# Patient Record
Sex: Male | Born: 2000 | Race: Black or African American | Hispanic: No | Marital: Single | Smoking: Never smoker
Health system: Southern US, Community
[De-identification: ages and names within clinical notes are randomized; demographics above are authoritative.]

## PROBLEM LIST (undated history)

## (undated) DIAGNOSIS — J302 Other seasonal allergic rhinitis: Secondary | ICD-10-CM

---
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
## 2020-04-22 ENCOUNTER — Encounter (HOSPITAL_COMMUNITY): Payer: Self-pay

## 2020-04-22 ENCOUNTER — Emergency Department (HOSPITAL_COMMUNITY)
Admission: EM | Admit: 2020-04-22 | Discharge: 2020-04-23 | Disposition: A | Discharge status: Home / Self Care | Payer: Medicaid Other | Attending: Emergency Medicine | Admitting: Emergency Medicine

## 2020-04-22 ENCOUNTER — Other Ambulatory Visit: Payer: Self-pay

## 2020-04-22 ENCOUNTER — Emergency Department (HOSPITAL_COMMUNITY): Payer: Medicaid Other

## 2020-04-22 DIAGNOSIS — R079 Chest pain, unspecified: Secondary | ICD-10-CM | POA: Diagnosis present

## 2020-04-22 DIAGNOSIS — R0602 Shortness of breath: Secondary | ICD-10-CM | POA: Insufficient documentation

## 2020-04-22 DIAGNOSIS — R0981 Nasal congestion: Secondary | ICD-10-CM | POA: Diagnosis not present

## 2020-04-22 DIAGNOSIS — R0789 Other chest pain: Secondary | ICD-10-CM | POA: Insufficient documentation

## 2020-04-22 NOTE — Discharge Instructions (Signed)
Try pepcid or tagamet up to twice a day.  Try to avoid things that may make this worse, most commonly these are spicy foods tomato based products fatty foods chocolate and peppermint.  Alcohol and tobacco can also make this worse.  Return to the emergency department for sudden worsening pain fever or inability to eat or drink.  

## 2020-04-22 NOTE — ED Triage Notes (Signed)
Emergency Medicine Provider Triage Evaluation Note  George Fields , a 20 y.o. male  was evaluated in triage.  Pt complains of chest pain x 3 days ago. No fever. No CAD, or family hx of cardiac disease.   Review of Systems  Positive: Chest pain, sob, nasal congestion.  Negative: fever  Physical Exam  BP 133/82 (BP Location: Left Arm)   Pulse 75   Temp 98.4 F (36.9 C) (Oral)   Resp 14   Ht 5\' 9"  (1.753 m)   Wt 78 kg   SpO2 100%   BMI 25.40 kg/m  Gen:   Awake, no distress   HEENT:  Atraumatic Resp:  Normal effort  Cardiac:  Normal rate  Abd:   Nondistended, nontender  MSK:   Moves extremities without difficulty  Neuro:  Speech clear   Medical Decision Making  Medically screening exam initiated at 8:24 PM.  Appropriate orders placed.  Jacolby Risby was informed that the remainder of the evaluation will be completed by another provider, this initial triage assessment does not replace that evaluation, and the importance of remaining in the ED until their evaluation is complete.  Clinical Impression  Patient here with chest pain and shortness of breath, pain feels worse with exhaling. Non smoker.    Helene Shoe, PA-C 04/22/20 2026

## 2020-04-22 NOTE — ED Provider Notes (Signed)
[facility]-EMERGENCY DEPT Provider Note   CSN: [id number] Arrival date & time: 04/22/20  1947     History Chief Complaint  Patient presents with  . Nasal Congestion    George Fields is a 20 y.o. male.  21 yo M with a chief complaints of chest pain.  This is been off and on for the past few days.  Lasts for seconds at a time seems to come and go at random.  Not exertional.  Has some shortness of breath sometimes with it.  Denies nausea or vomiting.  Denies cough or fever.  Has had some nasal congestion going on for a few weeks now consistent with his prior history of allergies.  Patient denies history of MI, denies hypertension hyperlipidemia diabetes or smoking.  Denies family history of MI.  Patient denies history of PE or DVT denies hemoptysis denies unilateral lower extremity edema denies recent surgery immobilization hospitalization estrogen use or history of cancer.   The history is provided by the patient.  Illness Severity:  Moderate Onset quality:  Gradual Duration:  2 days Timing:  Intermittent Progression:  Waxing and waning Chronicity:  New Associated symptoms: chest pain, congestion and shortness of breath   Associated symptoms: no abdominal pain, no diarrhea, no fever, no headaches, no myalgias, no rash and no vomiting        Past Medical History:  Diagnosis Date  . Seasonal allergies     There are no problems to display for this patient.   History reviewed. No pertinent surgical history.     History reviewed. No pertinent family history.  Social History   Tobacco Use  . Smoking status: Never Smoker  . Smokeless tobacco: Never Used  Substance Use Topics  . Alcohol use: Yes  . Drug use: Yes    Types: Marijuana    Home Medications Prior to Admission medications   Medication Sig Start Date End Date Taking? Authorizing Provider  cetirizine (ZYRTEC ALLERGY) 10 MG tablet Take 1 tablet (10 mg total) by mouth at  bedtime. Patient not taking: Reported on 04/25/2019 12/24/13 04/25/19  Lowanda Foster, NP  loratadine (CLARITIN) 10 MG tablet Take 1 tablet (10 mg total) by mouth daily. Patient not taking: Reported on 04/25/2019 04/16/16 04/25/19  Audry Pili, PA-C  potassium chloride SA (KLOR-CON) 20 MEQ tablet Take 1 tablet (20 mEq total) by mouth 2 (two) times daily. Patient not taking: Reported on 04/25/2019 04/02/19 04/25/19  Devoria Albe, MD    Allergies    Patient has no known allergies.  Review of Systems   Review of Systems  Constitutional: Negative for chills and fever.  HENT: Positive for congestion. Negative for facial swelling.   Eyes: Negative for discharge and visual disturbance.  Respiratory: Positive for shortness of breath.   Cardiovascular: Positive for chest pain. Negative for palpitations.  Gastrointestinal: Negative for abdominal pain, diarrhea and vomiting.  Musculoskeletal: Negative for arthralgias and myalgias.  Skin: Negative for color change and rash.  Neurological: Negative for tremors, syncope and headaches.  Psychiatric/Behavioral: Negative for confusion and dysphoric mood.    Physical Exam Updated Vital Signs BP 133/82 (BP Location: Left Arm)   Pulse 75   Temp 98.4 F (36.9 C) (Oral)   Resp 14   Ht 5\' 9"  (1.753 m)   Wt 78 kg   SpO2 100%   BMI 25.40 kg/m   Physical Exam Vitals and nursing note reviewed.  Constitutional:      Appearance: He is well-developed.  HENT:  Head: Normocephalic and atraumatic.  Eyes:     Pupils: Pupils are equal, round, and reactive to light.  Neck:     Vascular: No JVD.  Cardiovascular:     Rate and Rhythm: Normal rate and regular rhythm.     Heart sounds: No murmur heard. No friction rub. No gallop.   Pulmonary:     Effort: No respiratory distress.     Breath sounds: No wheezing.  Abdominal:     General: There is no distension.     Tenderness: There is no abdominal tenderness. There is no guarding or rebound.  Musculoskeletal:         General: Normal range of motion.     Cervical back: Normal range of motion and neck supple.  Skin:    Coloration: Skin is not pale.     Findings: No rash.  Neurological:     Mental Status: He is alert and oriented to person, place, and time.  Psychiatric:        Behavior: Behavior normal.     ED Results / Procedures / Treatments   Labs (all labs ordered are listed, but only abnormal results are displayed) Labs Reviewed - No data to display  EKG EKG Interpretation  Date/Time:  Tuesday April 22 2020 23:29:34 EDT Ventricular Rate:  63 PR Interval:  147 QRS Duration: 104 QT Interval:  382 QTC Calculation: 391 R Axis:   82 Text Interpretation: Sinus rhythm ST elev, probable normal early repol pattern flipped t waves in inferior leads seen on prior Otherwise no significant change Confirmed by Melene Plan [phone]) on 04/22/2020 11:55:36 PM   Radiology DG Chest 2 View  Result Date: 04/22/2020 CLINICAL DATA:  Mid chest pain and shortness of breath for 1 week. EXAM: CHEST - 2 VIEW COMPARISON:  Chest x-ray 04/02/2019 FINDINGS: The cardiac silhouette, mediastinal and hilar contours are normal. The lungs are clear. The bony thorax is intact. IMPRESSION: Normal chest x-ray. Electronically Signed   By: Rudie Meyer M.D.   On: 04/22/2020 20:41    Procedures Procedures   Medications Ordered in ED Medications - No data to display  ED Course  I have reviewed the triage vital signs and the nursing notes.  Pertinent labs & imaging results that were available during my care of the patient were reviewed by me and considered in my medical decision making (see chart for details).    MDM Rules/Calculators/A&P                          20 yo M with a chief complaint of chest discomfort.  Atypical in nature going on for a few days now.  Patient is extremely low risk for an MI.  Chest x-ray viewed by me without focal infiltrate or pneumothorax.  PERC negative.  11:57 PM:  I have discussed  the diagnosis/risks/treatment options with the patient and believe the pt to be eligible for discharge home to follow-up with PCP. We also discussed returning to the ED immediately if new or worsening sx occur. We discussed the sx which are most concerning (e.g., sudden worsening pain, fever, inability to tolerate by mouth) that necessitate immediate return. Medications administered to the patient during their visit and any new prescriptions provided to the patient are listed below.  Medications given during this visit Medications - No data to display   The patient appears reasonably screen and/or stabilized for discharge and I doubt any other medical condition or other [facility]  requiring further screening, evaluation, or treatment in the ED at this time prior to discharge.   Final Clinical Impression(s) / ED Diagnoses Final diagnoses:  Nonspecific chest pain    Rx / DC Orders ED Discharge Orders    None       Melene Plan, DO 04/22/20 2357

## 2020-04-22 NOTE — ED Triage Notes (Signed)
Pt c/o intermittent SOB and congestion x 3 days, denies fevers/cough. States he has felt fine all day today and had improvement with taking a benadryl yesterday

## 2020-04-22 NOTE — ED Notes (Signed)
Patient was called to take back to a room but no response. x1 

## 2021-01-11 ENCOUNTER — Encounter (HOSPITAL_COMMUNITY): Payer: Self-pay

## 2021-01-11 ENCOUNTER — Other Ambulatory Visit: Payer: Self-pay

## 2021-01-11 ENCOUNTER — Emergency Department (HOSPITAL_COMMUNITY)
Admission: EM | Admit: 2021-01-11 | Discharge: 2021-01-12 | Disposition: A | Discharge status: Home / Self Care | Payer: Medicaid Other | Attending: Emergency Medicine | Admitting: Emergency Medicine

## 2021-01-11 DIAGNOSIS — R070 Pain in throat: Secondary | ICD-10-CM | POA: Diagnosis present

## 2021-01-11 DIAGNOSIS — R0989 Other specified symptoms and signs involving the circulatory and respiratory systems: Secondary | ICD-10-CM | POA: Insufficient documentation

## 2021-01-11 DIAGNOSIS — Z5321 Procedure and treatment not carried out due to patient leaving prior to being seen by health care provider: Secondary | ICD-10-CM | POA: Diagnosis not present

## 2021-01-11 NOTE — ED Triage Notes (Signed)
Pt states he choked on a piece of seafood earlier at dinner, pt states he made himself cough and believes he coughed it out. Pt states his throat "feels funny". Pt denies SOB. Pt able to speak with no difficulty. Pt does not have a raspy or hoarse voice, no bleeding.

## 2021-01-11 NOTE — ED Notes (Signed)
Cortni Coutre, PA bedside.

## 2021-01-12 NOTE — ED Provider Notes (Signed)
Emergency Medicine Provider Triage Evaluation Note  George Fields , a 21 y.o. male  was evaluated in triage.  Pt complains of throat discomfort. Ate shrimp pta and thought he ate a shell. Started coughing and now feels abnormal sensation in throat.  Review of Systems  Positive: Abnormal sensation in throat after choking Negative: Rash, wheezing, sob, angioedema  Physical Exam  BP 122/79    Pulse 77    Temp 98 F (36.7 C) (Oral)    Resp 18    SpO2 99%  Gen:   Awake, no distress   Resp:  Normal effort  MSK:   Moves extremities without difficulty  Other:  Clear speech, no distress, no rashes, no wheezing  Medical Decision Making  Medically screening exam initiated at 12:42 AM.  Appropriate orders placed.  George Fields was informed that the remainder of the evaluation will be completed by another provider, this initial triage assessment does not replace that evaluation, and the importance of remaining in the ED until their evaluation is complete.  No evidence of anaphylaxis. Airway patent.    George Fields 01/12/21 0043    Paula Libra, MD 01/12/21 [phone]

## 2021-05-19 ENCOUNTER — Ambulatory Visit (HOSPITAL_COMMUNITY)
Admission: EM | Admit: 2021-05-19 | Discharge: 2021-05-19 | Disposition: A | Discharge status: Home / Self Care | Payer: Medicaid Other | Attending: Emergency Medicine | Admitting: Emergency Medicine

## 2021-05-19 ENCOUNTER — Ambulatory Visit (INDEPENDENT_AMBULATORY_CARE_PROVIDER_SITE_OTHER): Payer: Medicaid Other

## 2021-05-19 ENCOUNTER — Encounter (HOSPITAL_COMMUNITY): Payer: Self-pay | Admitting: Emergency Medicine

## 2021-05-19 DIAGNOSIS — S62306A Unspecified fracture of fifth metacarpal bone, right hand, initial encounter for closed fracture: Secondary | ICD-10-CM | POA: Diagnosis not present

## 2021-05-19 NOTE — ED Provider Notes (Signed)
?[facility] ? ? ? ?CSN: [id number] ?Arrival date & time: 05/19/21  1246 ? ? ?  ? ?History   ?Chief Complaint ?Chief Complaint  ?Patient presents with  ? Hand Injury  ? ? ?HPI ?George Fields is a 21 y.o. male.  ? ?Patient presents with right hand pain and swelling for 2 days after possible injury.  Endorses that he hit his hand against something during a lot of chaos 2 days ago.  Denies punching object or person.range of motion intact but elicits pain.  Endorses numbness to the fifth right finger.  Has not attempted treatment of symptoms. ? ? ? ? ?Past Medical History:  ?Diagnosis Date  ? Seasonal allergies   ? ? ?There are no problems to display for this patient. ? ? ?History reviewed. No pertinent surgical history. ? ? ? ? ?Home Medications   ? ?Prior to Admission medications   ?Medication Sig Start Date End Date Taking? Authorizing Provider  ?cetirizine (ZYRTEC ALLERGY) 10 MG tablet Take 1 tablet (10 mg total) by mouth at bedtime. ?Patient not taking: Reported on 04/25/2019 12/24/13 04/25/19  Lowanda Foster, NP  ?loratadine (CLARITIN) 10 MG tablet Take 1 tablet (10 mg total) by mouth daily. ?Patient not taking: Reported on 04/25/2019 04/16/16 04/25/19  Audry Pili, PA-C  ?potassium chloride SA (KLOR-CON) 20 MEQ tablet Take 1 tablet (20 mEq total) by mouth 2 (two) times daily. ?Patient not taking: Reported on 04/25/2019 04/02/19 04/25/19  Devoria Albe, MD  ? ? ?Family History ?No family history on file. ? ?Social History ?Social History  ? ?Tobacco Use  ? Smoking status: Never  ? Smokeless tobacco: Never  ?Substance Use Topics  ? Alcohol use: Yes  ? Drug use: Yes  ?  Types: Marijuana  ? ? ? ?Allergies   ?Patient has no known allergies. ? ? ?Review of Systems ?Review of Systems  ?Constitutional: Negative.   ?Respiratory: Negative.    ?Cardiovascular: Negative.   ?Skin: Negative.   ? ? ?Physical Exam ?Triage Vital Signs ?ED Triage Vitals  ?Enc Vitals Group  ?   BP 05/19/21 1345 113/77  ?   Pulse Rate 05/19/21 1345  62  ?   Resp 05/19/21 1345 14  ?   Temp 05/19/21 1345 98.7 ?F (37.1 ?C)  ?   Temp Source 05/19/21 1345 Oral  ?   SpO2 05/19/21 1345 98 %  ?   Weight --   ?   Height --   ?   Head Circumference --   ?   Peak Flow --   ?   Pain Score 05/19/21 1344 10  ?   Pain Loc --   ?   Pain Edu? --   ?   Excl. in GC? --   ? ?No data found. ? ?Updated Vital Signs ?BP 113/77 (BP Location: Left Arm)   Pulse 62   Temp 98.7 ?F (37.1 ?C) (Oral)   Resp 14   SpO2 98%  ? ?Visual Acuity ?Right Eye Distance:   ?Left Eye Distance:   ?Bilateral Distance:   ? ?Right Eye Near:   ?Left Eye Near:    ?Bilateral Near:    ? ?Physical Exam ?Constitutional:   ?   Appearance: Normal appearance.  ?HENT:  ?   Head: Normocephalic.  ?Eyes:  ?   Extraocular Movements: Extraocular movements intact.  ?Pulmonary:  ?   Effort: Pulmonary effort is normal.  ?Musculoskeletal:  ?   Comments: Unable to reproduce tenderness however pain is noted to  be at the base of the fourth and fifth metacarpal, moderate swelling over the dorsum aspect of the metacarpals, no ecchymosis, range of motion intact, decreased sensation at the fifth finger, capillary refill less than 3, 2+ radial pulse  ?Skin: ?   General: Skin is warm and dry.  ?Neurological:  ?   Mental Status: He is alert and oriented to person, place, and time. Mental status is at baseline.  ?Psychiatric:     ?   Mood and Affect: Mood normal.     ?   Behavior: Behavior normal.  ? ? ? ?UC Treatments / Results  ?Labs ?(all labs ordered are listed, but only abnormal results are displayed) ?Labs Reviewed - No data to display ? ?EKG ? ? ?Radiology ?No results found. ? ?Procedures ?Procedures (including critical care time) ? ?Medications Ordered in UC ?Medications - No data to display ? ?Initial Impression / Assessment and Plan / UC Course  ?I have reviewed the triage vital signs and the nursing notes. ? ?Pertinent labs & imaging results that were available during my care of the patient were reviewed by me and  considered in my medical decision making (see chart for details). ? ?Closed nondisplaced fracture of the fifth metacarpal bone of right hand, initial encounter ? ?Confirmed via x-ray, discussed findings with patient, ulnar gutter splint placed by Ortho tech, neurovascularly intact prior to and after placement, non weight bearing precautions given, may use over-the-counter Tylenol or ibuprofen for management of pain, recommended Ortho follow-up for further evaluation and management, work note given  ?Final Clinical Impressions(s) / UC Diagnoses  ? ?Final diagnoses:  ?None  ? ?Discharge Instructions   ?None ?  ? ?ED Prescriptions   ?None ?  ? ?PDMP not reviewed this encounter. ?  ?Valinda Hoar, NP ?05/19/21 1454 ? ?

## 2021-05-19 NOTE — ED Notes (Signed)
Ortho made aware of splint ordered.  

## 2021-05-19 NOTE — ED Triage Notes (Signed)
Pt thinks he hit something Saturday causing pain and swelling to right hand.  ?

## 2021-05-19 NOTE — Discharge Instructions (Addendum)
Your x-ray today showed a fracture ( break in bone) of base of your 5 th finger ? ?A splint has been applied to your hand/arm. This is used to protect your injury and prevent further damage. Leave in place until seen by orthopedic specialist. Do not put objects into splint to scratch skin. If numbness or tingling occurs after placement please return to Urgent Care for evaluation.  ? ?Follow up with orthopedic specialist in 1-2 weeks. Call practice to make appointment. Information listed below. You may go to any orthopedic provider you deem fit. ? ?Please do not put weight on fracture.  ? ? ?

## 2022-08-09 ENCOUNTER — Other Ambulatory Visit: Payer: Self-pay

## 2022-08-09 ENCOUNTER — Encounter (HOSPITAL_BASED_OUTPATIENT_CLINIC_OR_DEPARTMENT_OTHER): Payer: Self-pay | Admitting: Emergency Medicine

## 2022-08-09 ENCOUNTER — Emergency Department (HOSPITAL_BASED_OUTPATIENT_CLINIC_OR_DEPARTMENT_OTHER)
Admission: EM | Admit: 2022-08-09 | Discharge: 2022-08-09 | Disposition: A | Discharge status: Home / Self Care | Payer: Medicaid Other | Attending: Emergency Medicine | Admitting: Emergency Medicine

## 2022-08-09 ENCOUNTER — Emergency Department (HOSPITAL_BASED_OUTPATIENT_CLINIC_OR_DEPARTMENT_OTHER): Payer: Medicaid Other

## 2022-08-09 DIAGNOSIS — R6884 Jaw pain: Secondary | ICD-10-CM | POA: Diagnosis not present

## 2022-08-09 DIAGNOSIS — R519 Headache, unspecified: Secondary | ICD-10-CM | POA: Insufficient documentation

## 2022-08-09 MED ORDER — AMOXICILLIN 500 MG PO CAPS
500.0000 mg | ORAL_CAPSULE | Freq: Once | ORAL | Status: AC
  Administered 2022-08-09: 500 mg via ORAL
  Filled 2022-08-09: qty 1

## 2022-08-09 MED ORDER — ACETAMINOPHEN 325 MG PO TABS
650.0000 mg | ORAL_TABLET | Freq: Once | ORAL | Status: AC
  Administered 2022-08-09: 650 mg via ORAL
  Filled 2022-08-09: qty 2

## 2022-08-09 MED ORDER — AMOXICILLIN 500 MG PO CAPS: 500.0000 mg | ORAL_CAPSULE | Freq: Two times a day (BID) | ORAL | 0 refills | Status: AC

## 2022-08-09 NOTE — ED Triage Notes (Signed)
Pt arrives to ED with c/o left sided jaw pain x3 days.

## 2022-08-09 NOTE — ED Notes (Signed)
Patient verbalizes understanding of discharge instructions. Opportunity for questioning and answers were provided. Armband removed by staff, pt discharged from ED. Ambulated out to lobby  

## 2022-08-09 NOTE — Discharge Instructions (Signed)
Follow-up with dentist, primary care, neurology.  Return if symptoms worsen.  Take antibiotic as prescribed.

## 2022-08-09 NOTE — ED Provider Notes (Signed)
[name] Provider Note   CSN: [id number] Arrival date & time: 08/09/22  1308     History  Chief Complaint  Patient presents with   Jaw Pain    George Fields is a 22 y.o. male.  Patient here with left-sided headache, left jaw pain.  Denies any fever or chills.  No neck pain.  No obvious dental pain.  Nothing makes it worse or better.  Denies any trauma or hearing loss.  Denies any significant medical history otherwise.  The history is provided by the patient.       Home Medications Prior to Admission medications   Medication Sig Start Date End Date Taking? Authorizing Provider  amoxicillin (AMOXIL) 500 MG capsule Take 1 capsule (500 mg total) by mouth 2 (two) times daily for 7 days. 08/09/22 08/16/22 Yes Shemeika Starzyk, DO  cetirizine (ZYRTEC ALLERGY) 10 MG tablet Take 1 tablet (10 mg total) by mouth at bedtime. Patient not taking: Reported on 04/25/2019 12/24/13 04/25/19  Lowanda Foster, NP  loratadine (CLARITIN) 10 MG tablet Take 1 tablet (10 mg total) by mouth daily. Patient not taking: Reported on 04/25/2019 04/16/16 04/25/19  Audry Pili, PA-C  potassium chloride SA (KLOR-CON) 20 MEQ tablet Take 1 tablet (20 mEq total) by mouth 2 (two) times daily. Patient not taking: Reported on 04/25/2019 04/02/19 04/25/19  Devoria Albe, MD      Allergies    Patient has no known allergies.    Review of Systems   Review of Systems  Physical Exam Updated Vital Signs BP 126/80 (BP Location: Right Arm)   Pulse 61   Temp 98.6 F (37 C) (Oral)   Resp 14   Ht 5\' 9"  (1.753 m)   Wt 77.1 kg   SpO2 99%   BMI 25.10 kg/m  Physical Exam Vitals and nursing note reviewed.  Constitutional:      General: He is not in acute distress.    Appearance: He is well-developed. He is not ill-appearing.  HENT:     Head: Normocephalic and atraumatic.     Comments: Tenderness over the left angle of the mandible    Nose: Nose normal.     Mouth/Throat:      Mouth: Mucous membranes are moist.  Eyes:     Extraocular Movements: Extraocular movements intact.     Conjunctiva/sclera: Conjunctivae normal.     Pupils: Pupils are equal, round, and reactive to light.  Cardiovascular:     Rate and Rhythm: Normal rate and regular rhythm.     Heart sounds: No murmur heard. Pulmonary:     Effort: Pulmonary effort is normal. No respiratory distress.     Breath sounds: Normal breath sounds.  Abdominal:     Palpations: Abdomen is soft.     Tenderness: There is no abdominal tenderness.  Musculoskeletal:        General: No swelling.     Cervical back: Normal range of motion and neck supple.  Skin:    General: Skin is warm and dry.     Capillary Refill: Capillary refill takes less than 2 seconds.  Neurological:     General: No focal deficit present.     Mental Status: He is alert and oriented to person, place, and time.     Cranial Nerves: No cranial nerve deficit.     Sensory: No sensory deficit.     Motor: No weakness.     Coordination: Coordination normal.     Gait: Gait normal.  Comments: 5+ out of 5 strength throughout, normal sensation, no drift, normal finger-nose-finger, normal speech  Psychiatric:        Mood and Affect: Mood normal.     ED Results / Procedures / Treatments   Labs (all labs ordered are listed, but only abnormal results are displayed) Labs Reviewed - No data to display  EKG None  Radiology CT Head Wo Contrast  Result Date: 08/09/2022 CLINICAL DATA:  Headache EXAM: CT HEAD WITHOUT CONTRAST TECHNIQUE: Contiguous axial images were obtained from the base of the skull through the vertex without intravenous contrast. RADIATION DOSE REDUCTION: This exam was performed according to the departmental dose-optimization program which includes automated exposure control, adjustment of the mA and/or kV according to patient size and/or use of iterative reconstruction technique. COMPARISON:  None Available. FINDINGS: Brain: There is  no mass, hemorrhage or extra-axial collection. The size and configuration of the ventricles and extra-axial CSF spaces are normal. The brain parenchyma is normal, without acute or chronic infarction. Vascular: No abnormal hyperdensity of the major intracranial arteries or dural venous sinuses. No intracranial atherosclerosis. Skull: The visualized skull base, calvarium and extracranial soft tissues are normal. Sinuses/Orbits: No fluid levels or advanced mucosal thickening of the visualized paranasal sinuses. No mastoid or middle ear effusion. The orbits are normal. IMPRESSION: Normal head CT. Electronically Signed   By: Deatra Robinson M.D.   On: 08/09/2022 19:26    Procedures Procedures    Medications Ordered in ED Medications  acetaminophen (TYLENOL) tablet 650 mg (650 mg Oral Given 08/09/22 1740)  amoxicillin (AMOXIL) capsule 500 mg (500 mg Oral Given 08/09/22 1909)    ED Course/ Medical Decision Making/ A&P                             Medical Decision Making Amount and/or Complexity of Data Reviewed Radiology: ordered.  Risk OTC drugs. Prescription drug management.   George Fields is here with left-sided headache, left jaw pain.  Normal vitals.  No fever.  No significant medical history.  He is mostly tender over the left TMJ area.  He is having pain that radiates up into his left head.  Overall family concern may be some intracranial process.  He has no sign ear or throat infection.  May be some dental discomfort but he is not endorsing any dental pain.  Teeth overall appear well.  CT scan was ordered and per radiology report was unremarkable.  I reviewed interpreted as well.  Otherwise I think this could be a migraine or TMJ or grinding of his teeth.  It looks like he bites his inner gums.  Will put him on antibiotics for may be small dental infection.  Will have him follow-up with dentistry, primary care, neurology.  Understands return precautions.  Discharged in good condition.   Overall no concern for stroke or other acute process at this time.  This chart was dictated using voice recognition software.  Despite best efforts to proofread,  errors can occur which can change the documentation meaning.         Final Clinical Impression(s) / ED Diagnoses Final diagnoses:  Jaw pain  Nonintractable headache, unspecified chronicity pattern, unspecified headache type    Rx / DC Orders ED Discharge Orders          Ordered    amoxicillin (AMOXIL) 500 MG capsule  2 times daily        08/09/22 1932  Virgina Norfolk, DO 08/09/22 1933

## 2023-10-01 IMAGING — DX DG HAND COMPLETE 3+V*R*
3 series · 3 of 3 positions shown · non-contrast
Comparison: None Available.

CLINICAL DATA: Pain and swelling in right hand

EXAM:
RIGHT HAND - COMPLETE 3+ VIEW

[hand pa]
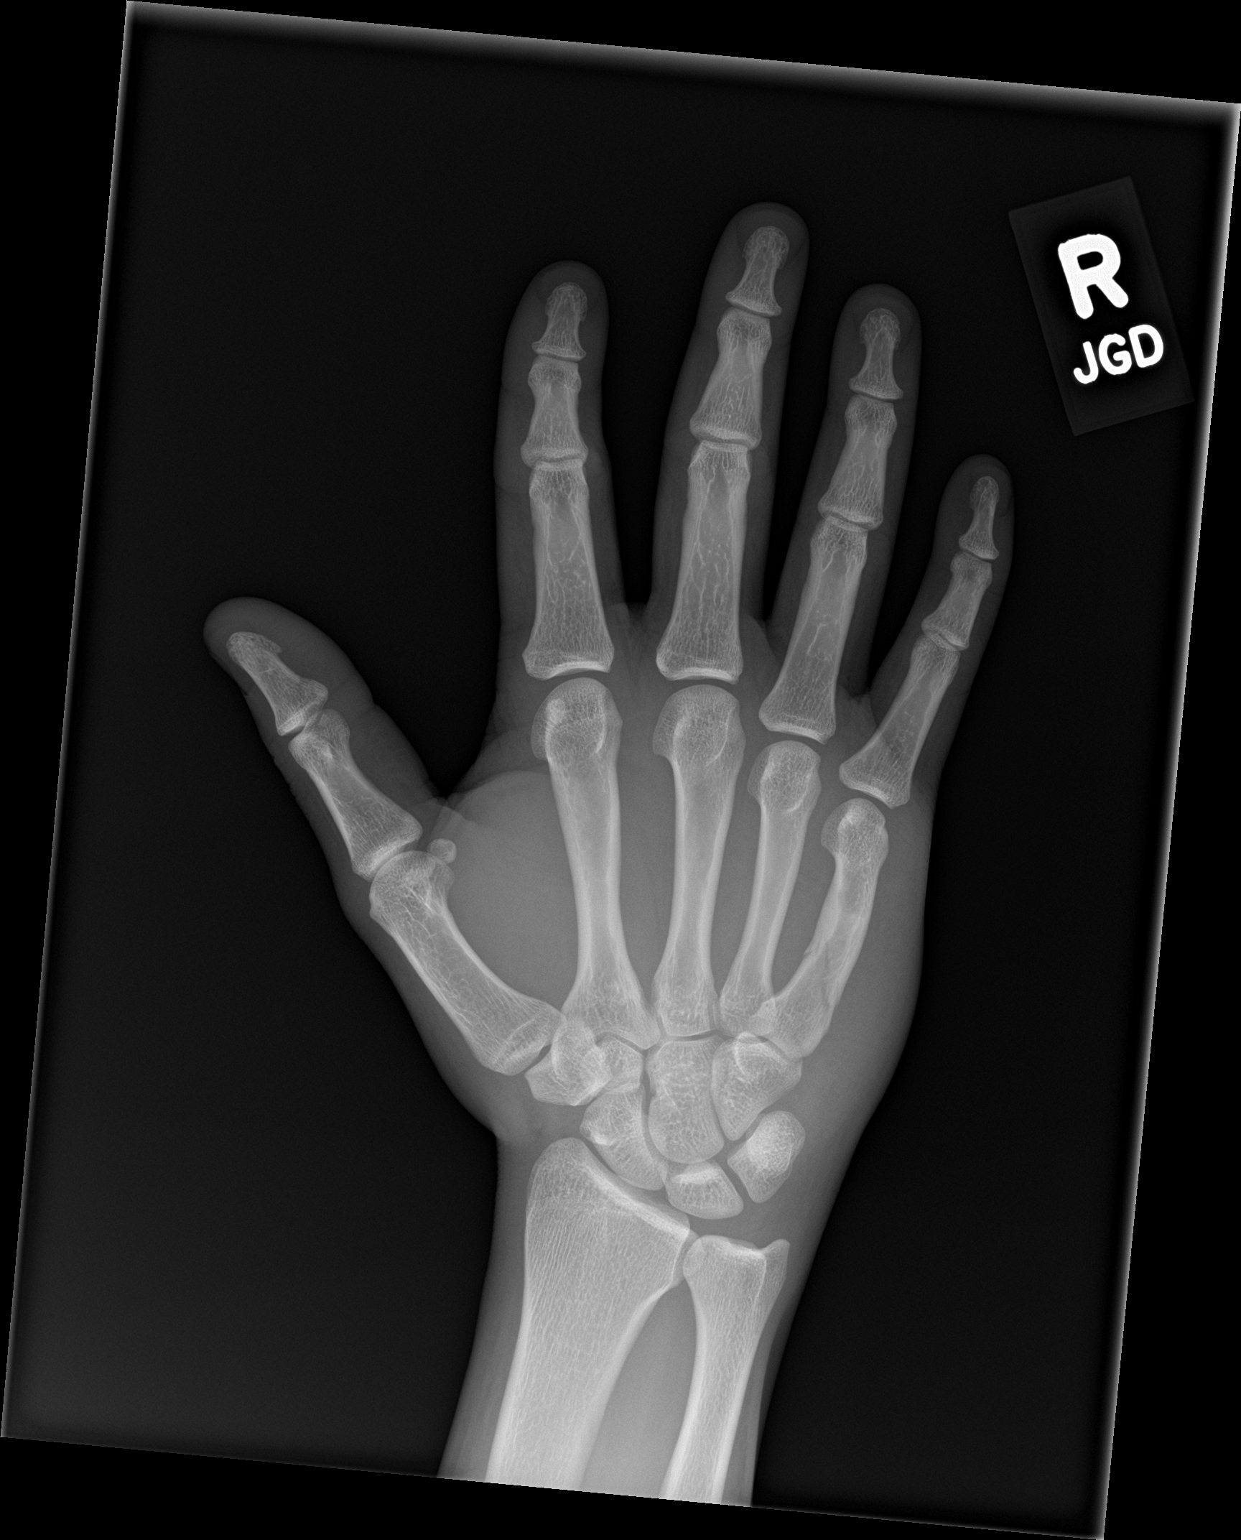

[hand obl]
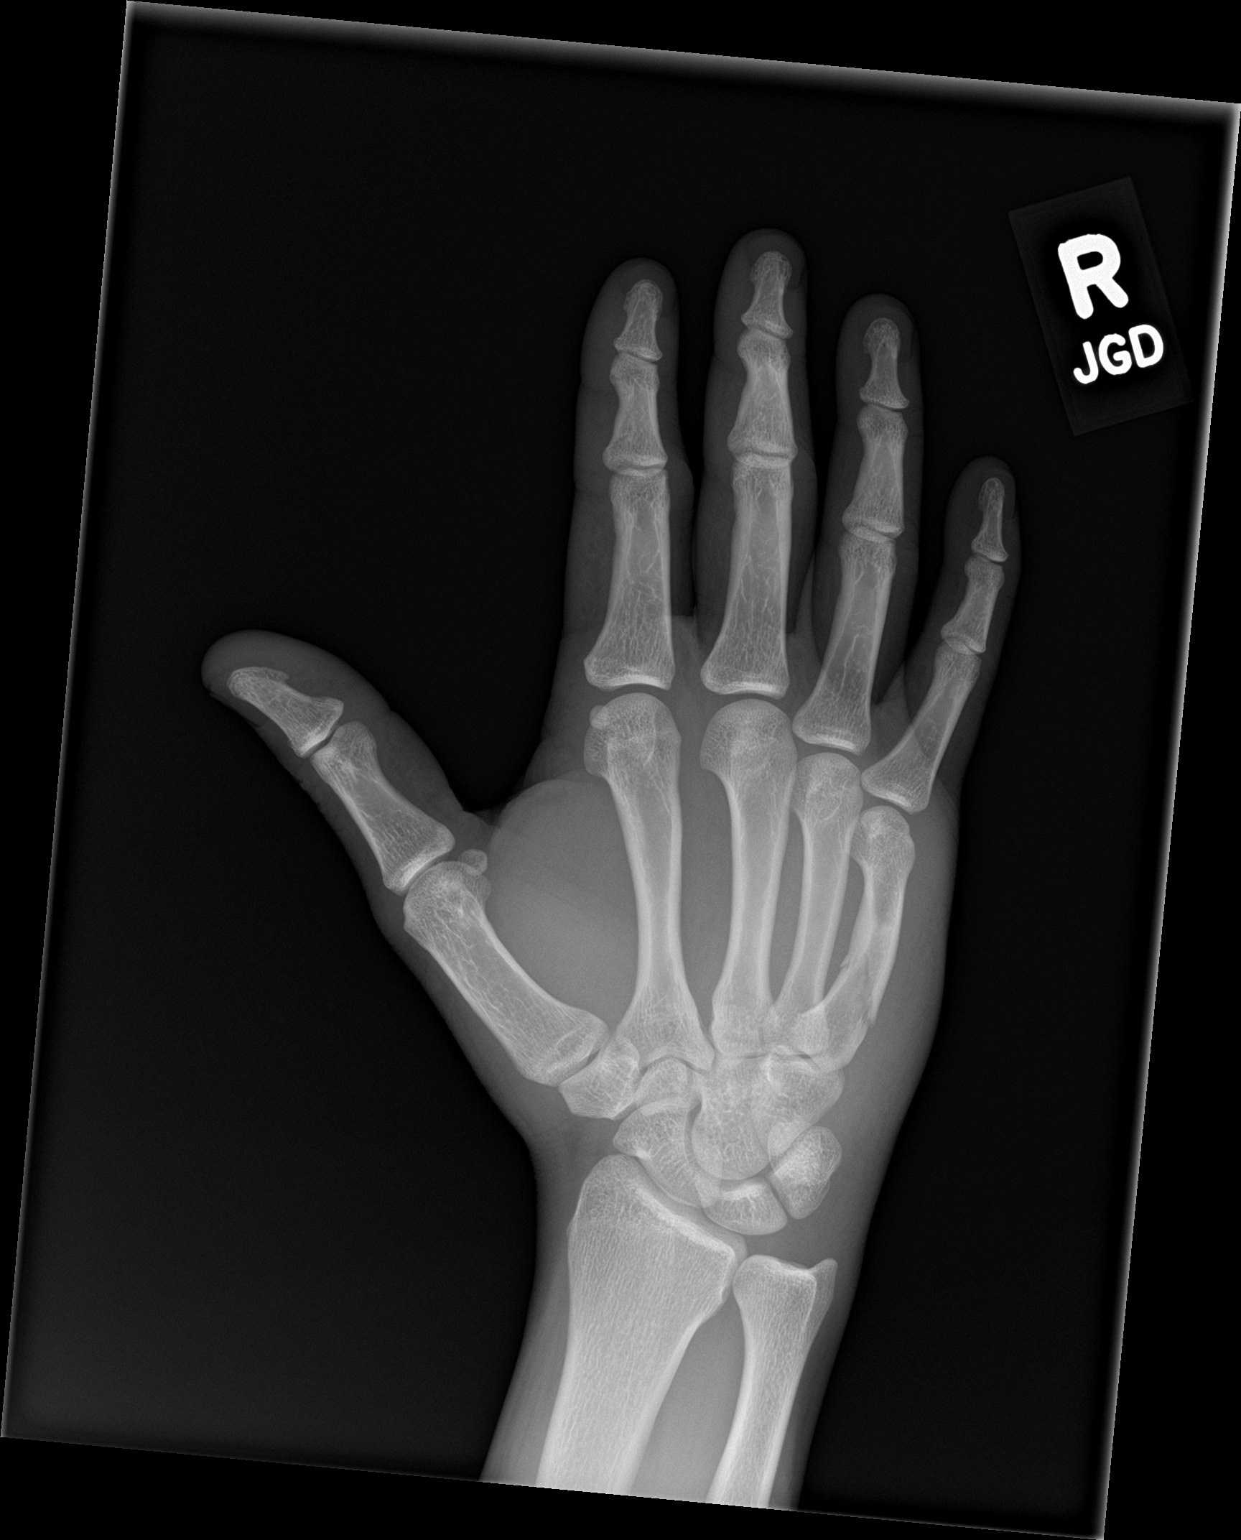

[hand lat]
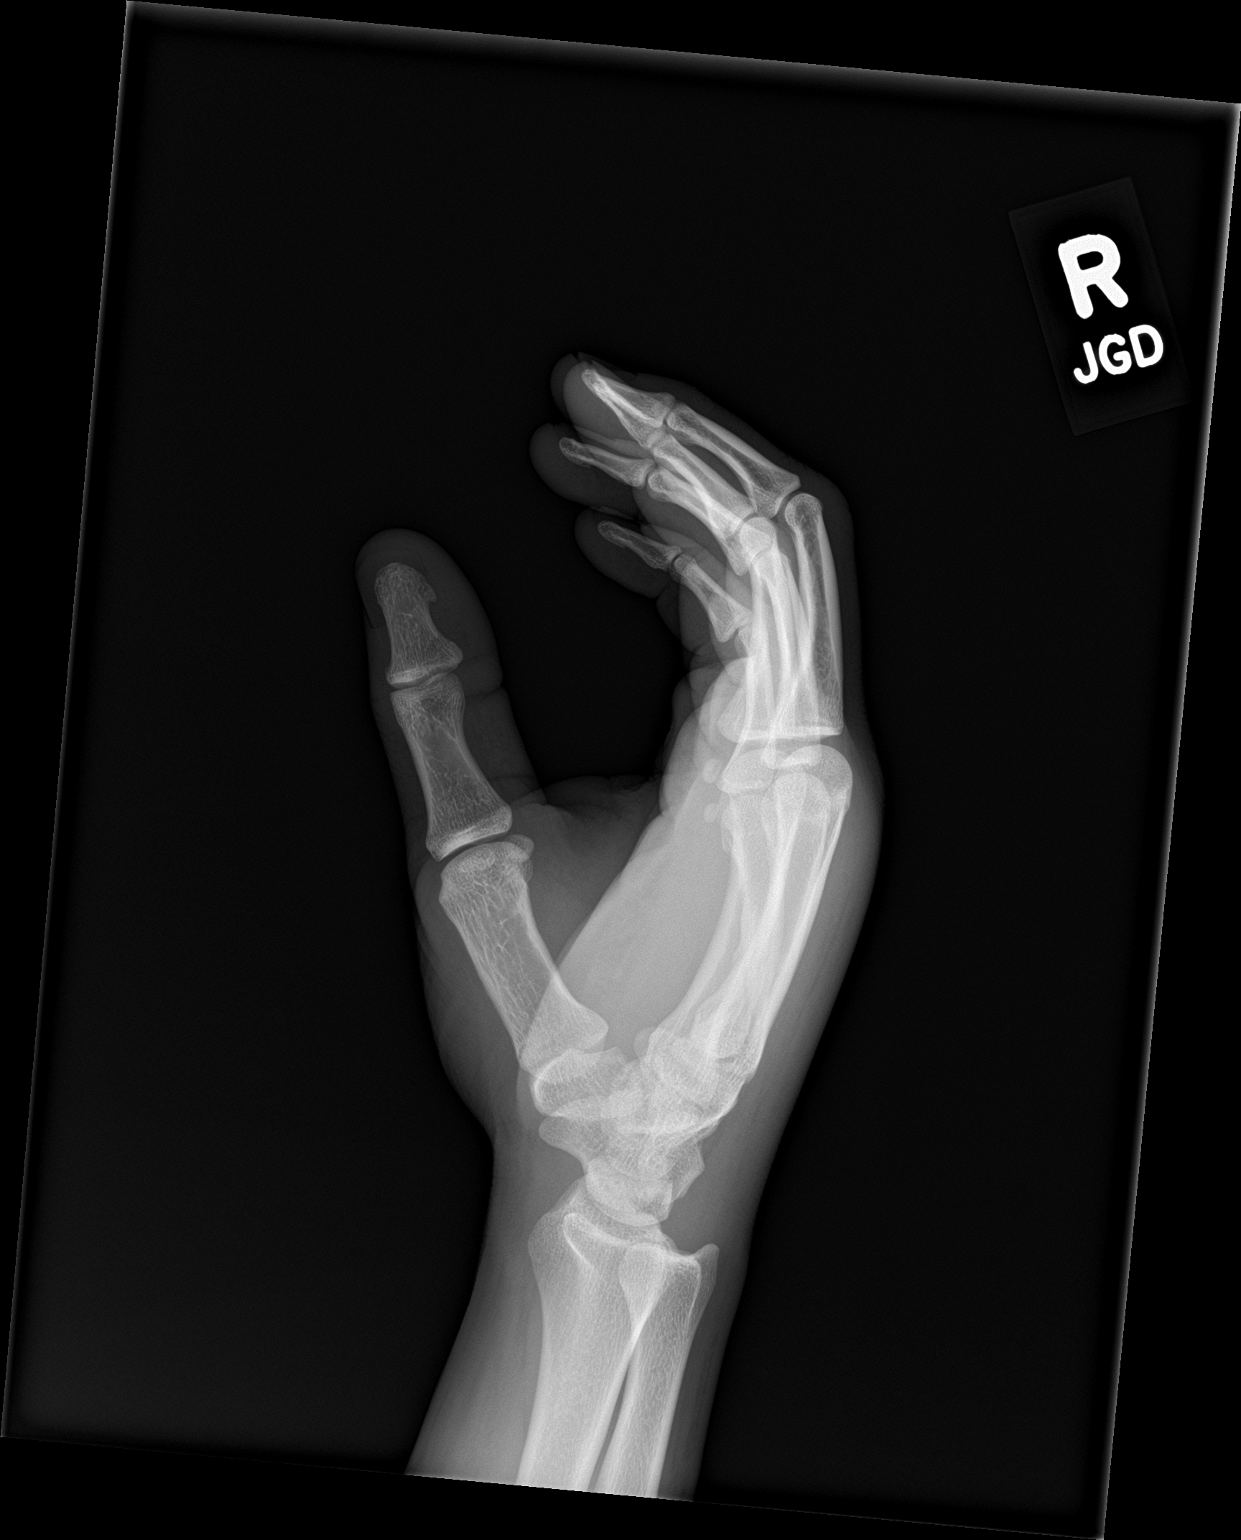

[3 of 3 positions shown; findings below may reference images not displayed]

FINDINGS: Minimally comminuted, nondisplaced, oblique, acute fracture through
the diaphysis of the fifth metacarpal. Overlying soft tissue
swelling. No other fracture is seen in the hand. Alignment is
unremarkable.
IMPRESSION: Nondisplaced, oblique acute fracture through the diaphysis fifth
metacarpal, with overlying soft tissue swelling.
